# Patient Record
Sex: Female | Born: 1960 | Race: Black or African American | Hispanic: No | Smoking: Never smoker
Health system: Southern US, Community
[De-identification: ages and names within clinical notes are randomized; demographics above are authoritative.]

---

## 2003-12-04 ENCOUNTER — Emergency Department (HOSPITAL_COMMUNITY): Admission: EM | Admit: 2003-12-04 | Discharge: 2003-12-04 | Payer: Self-pay | Admitting: *Deleted

## 2004-04-25 ENCOUNTER — Encounter: Admission: RE | Admit: 2004-04-25 | Discharge: 2004-04-25 | Payer: Self-pay | Admitting: Obstetrics and Gynecology

## 2004-05-01 ENCOUNTER — Encounter: Admission: RE | Admit: 2004-05-01 | Discharge: 2004-05-01 | Payer: Self-pay | Admitting: Obstetrics and Gynecology

## 2004-08-26 ENCOUNTER — Encounter: Admission: RE | Admit: 2004-08-26 | Discharge: 2004-08-26 | Payer: Self-pay | Admitting: General Surgery

## 2005-01-15 ENCOUNTER — Emergency Department (HOSPITAL_COMMUNITY): Admission: EM | Admit: 2005-01-15 | Discharge: 2005-01-15 | Payer: Self-pay | Admitting: Emergency Medicine

## 2005-06-22 ENCOUNTER — Encounter: Admission: RE | Admit: 2005-06-22 | Discharge: 2005-06-22 | Payer: Self-pay | Admitting: Obstetrics and Gynecology

## 2005-06-30 ENCOUNTER — Encounter: Admission: RE | Admit: 2005-06-30 | Discharge: 2005-06-30 | Payer: Self-pay | Admitting: Obstetrics and Gynecology

## 2005-12-16 ENCOUNTER — Encounter: Admission: RE | Admit: 2005-12-16 | Discharge: 2005-12-16 | Payer: Self-pay | Admitting: *Deleted

## 2006-02-16 ENCOUNTER — Encounter (HOSPITAL_COMMUNITY): Admission: RE | Admit: 2006-02-16 | Discharge: 2006-05-17 | Payer: Self-pay | Admitting: Endocrinology

## 2006-02-22 ENCOUNTER — Encounter: Admission: RE | Admit: 2006-02-22 | Discharge: 2006-02-22 | Payer: Self-pay | Admitting: Endocrinology

## 2006-02-25 ENCOUNTER — Encounter: Admission: RE | Admit: 2006-02-25 | Discharge: 2006-02-25 | Payer: Self-pay | Admitting: Endocrinology

## 2006-05-07 ENCOUNTER — Encounter: Admission: RE | Admit: 2006-05-07 | Discharge: 2006-05-07 | Payer: Self-pay | Admitting: Obstetrics and Gynecology

## 2006-06-03 ENCOUNTER — Emergency Department (HOSPITAL_COMMUNITY): Admission: EM | Admit: 2006-06-03 | Discharge: 2006-06-03 | Payer: Self-pay | Admitting: Family Medicine

## 2006-11-13 ENCOUNTER — Emergency Department (HOSPITAL_COMMUNITY): Admission: EM | Admit: 2006-11-13 | Discharge: 2006-11-13 | Payer: Self-pay | Admitting: Emergency Medicine

## 2007-01-08 ENCOUNTER — Emergency Department (HOSPITAL_COMMUNITY): Admission: EM | Admit: 2007-01-08 | Discharge: 2007-01-08 | Payer: Self-pay | Admitting: Family Medicine

## 2007-05-09 ENCOUNTER — Encounter: Admission: RE | Admit: 2007-05-09 | Discharge: 2007-05-09 | Payer: Self-pay | Admitting: Obstetrics and Gynecology

## 2008-04-06 ENCOUNTER — Emergency Department (HOSPITAL_COMMUNITY): Admission: EM | Admit: 2008-04-06 | Discharge: 2008-04-06 | Payer: Self-pay | Admitting: Emergency Medicine

## 2008-06-21 ENCOUNTER — Encounter: Admission: RE | Admit: 2008-06-21 | Discharge: 2008-06-21 | Payer: Self-pay | Admitting: Obstetrics and Gynecology

## 2008-08-09 ENCOUNTER — Encounter: Admission: RE | Admit: 2008-08-09 | Discharge: 2008-08-09 | Payer: Self-pay | Admitting: Obstetrics and Gynecology

## 2009-04-15 ENCOUNTER — Encounter: Admission: RE | Admit: 2009-04-15 | Discharge: 2009-04-15 | Payer: Self-pay | Admitting: Obstetrics and Gynecology

## 2009-07-10 ENCOUNTER — Encounter: Admission: RE | Admit: 2009-07-10 | Discharge: 2009-07-10 | Payer: Self-pay | Admitting: Obstetrics and Gynecology

## 2009-12-13 ENCOUNTER — Emergency Department (HOSPITAL_COMMUNITY): Admission: EM | Admit: 2009-12-13 | Discharge: 2009-12-13 | Payer: Self-pay | Admitting: Emergency Medicine

## 2010-01-17 ENCOUNTER — Emergency Department (HOSPITAL_COMMUNITY): Admission: EM | Admit: 2010-01-17 | Discharge: 2010-01-17 | Payer: Self-pay | Admitting: Emergency Medicine

## 2010-07-11 ENCOUNTER — Encounter: Admission: RE | Admit: 2010-07-11 | Discharge: 2010-07-11 | Payer: Self-pay | Admitting: Obstetrics and Gynecology

## 2010-12-01 ENCOUNTER — Ambulatory Visit (HOSPITAL_COMMUNITY)
Admission: RE | Admit: 2010-12-01 | Discharge: 2010-12-01 | Payer: Self-pay | Source: Home / Self Care | Attending: Gastroenterology | Admitting: Gastroenterology

## 2011-01-16 ENCOUNTER — Other Ambulatory Visit (HOSPITAL_COMMUNITY): Payer: Self-pay | Admitting: Orthopedic Surgery

## 2011-01-16 DIAGNOSIS — T148XXA Other injury of unspecified body region, initial encounter: Secondary | ICD-10-CM

## 2011-01-16 DIAGNOSIS — M25561 Pain in right knee: Secondary | ICD-10-CM

## 2011-01-29 ENCOUNTER — Ambulatory Visit (HOSPITAL_COMMUNITY)
Admission: RE | Admit: 2011-01-29 | Discharge: 2011-01-29 | Disposition: A | Discharge status: Home / Self Care | Payer: 59 | Source: Ambulatory Visit | Attending: Orthopedic Surgery | Admitting: Orthopedic Surgery

## 2011-01-29 DIAGNOSIS — M25561 Pain in right knee: Secondary | ICD-10-CM

## 2011-01-29 DIAGNOSIS — M7120 Synovial cyst of popliteal space [Baker], unspecified knee: Secondary | ICD-10-CM | POA: Insufficient documentation

## 2011-01-29 DIAGNOSIS — T148XXA Other injury of unspecified body region, initial encounter: Secondary | ICD-10-CM

## 2011-01-29 DIAGNOSIS — M25469 Effusion, unspecified knee: Secondary | ICD-10-CM | POA: Insufficient documentation

## 2011-01-29 DIAGNOSIS — M25569 Pain in unspecified knee: Secondary | ICD-10-CM | POA: Insufficient documentation

## 2011-01-29 DIAGNOSIS — M2240 Chondromalacia patellae, unspecified knee: Secondary | ICD-10-CM | POA: Insufficient documentation

## 2011-02-09 LAB — POCT I-STAT, CHEM 8
BUN: 10 mg/dL (ref 6–23)
Calcium, Ion: 1.16 mmol/L (ref 1.12–1.32)
Chloride: 105 mEq/L (ref 96–112)
Creatinine, Ser: 1 mg/dL (ref 0.4–1.2)
Glucose, Bld: 87 mg/dL (ref 70–99)
HCT: 41 % (ref 36.0–46.0)
Hemoglobin: 13.9 g/dL (ref 12.0–15.0)
Potassium: 4 mEq/L (ref 3.5–5.1)
Sodium: 138 mEq/L (ref 135–145)
TCO2: 28 mmol/L (ref 0–100)

## 2011-05-04 ENCOUNTER — Ambulatory Visit (INDEPENDENT_AMBULATORY_CARE_PROVIDER_SITE_OTHER): Payer: Commercial Managed Care - PPO

## 2011-05-04 ENCOUNTER — Inpatient Hospital Stay (INDEPENDENT_AMBULATORY_CARE_PROVIDER_SITE_OTHER)
Admission: RE | Admit: 2011-05-04 | Discharge: 2011-05-04 | Disposition: A | Discharge status: Home / Self Care | Payer: Commercial Managed Care - PPO | Source: Ambulatory Visit | Attending: Emergency Medicine | Admitting: Emergency Medicine

## 2011-05-04 DIAGNOSIS — S92919A Unspecified fracture of unspecified toe(s), initial encounter for closed fracture: Secondary | ICD-10-CM

## 2011-07-23 ENCOUNTER — Other Ambulatory Visit (HOSPITAL_COMMUNITY)
Admission: RE | Admit: 2011-07-23 | Discharge: 2011-07-23 | Disposition: A | Discharge status: Home / Self Care | Payer: 59 | Source: Ambulatory Visit | Attending: Obstetrics and Gynecology | Admitting: Obstetrics and Gynecology

## 2011-07-23 ENCOUNTER — Other Ambulatory Visit: Payer: Self-pay | Admitting: Obstetrics and Gynecology

## 2011-07-23 DIAGNOSIS — Z01419 Encounter for gynecological examination (general) (routine) without abnormal findings: Secondary | ICD-10-CM | POA: Insufficient documentation

## 2011-07-23 DIAGNOSIS — Z1231 Encounter for screening mammogram for malignant neoplasm of breast: Secondary | ICD-10-CM

## 2011-07-23 DIAGNOSIS — Z1159 Encounter for screening for other viral diseases: Secondary | ICD-10-CM | POA: Insufficient documentation

## 2011-07-31 ENCOUNTER — Ambulatory Visit
Admission: RE | Admit: 2011-07-31 | Discharge: 2011-07-31 | Disposition: A | Discharge status: Home / Self Care | Payer: 59 | Source: Ambulatory Visit | Attending: Obstetrics and Gynecology | Admitting: Obstetrics and Gynecology

## 2011-07-31 DIAGNOSIS — Z1231 Encounter for screening mammogram for malignant neoplasm of breast: Secondary | ICD-10-CM

## 2011-09-23 ENCOUNTER — Inpatient Hospital Stay (INDEPENDENT_AMBULATORY_CARE_PROVIDER_SITE_OTHER)
Admission: RE | Admit: 2011-09-23 | Discharge: 2011-09-23 | Disposition: A | Discharge status: Home / Self Care | Payer: 59 | Source: Ambulatory Visit | Attending: Family Medicine | Admitting: Family Medicine

## 2011-09-23 DIAGNOSIS — J40 Bronchitis, not specified as acute or chronic: Secondary | ICD-10-CM

## 2011-09-23 DIAGNOSIS — J069 Acute upper respiratory infection, unspecified: Secondary | ICD-10-CM

## 2013-03-08 ENCOUNTER — Other Ambulatory Visit: Payer: Self-pay | Admitting: Obstetrics and Gynecology

## 2013-03-08 DIAGNOSIS — Z1231 Encounter for screening mammogram for malignant neoplasm of breast: Secondary | ICD-10-CM

## 2013-04-12 ENCOUNTER — Ambulatory Visit
Admission: RE | Admit: 2013-04-12 | Discharge: 2013-04-12 | Disposition: A | Discharge status: Home / Self Care | Payer: Private Health Insurance - Indemnity | Source: Ambulatory Visit | Attending: Obstetrics and Gynecology | Admitting: Obstetrics and Gynecology

## 2013-04-12 DIAGNOSIS — Z1231 Encounter for screening mammogram for malignant neoplasm of breast: Secondary | ICD-10-CM

## 2014-04-02 ENCOUNTER — Other Ambulatory Visit: Payer: Self-pay

## 2014-04-02 DIAGNOSIS — Z1231 Encounter for screening mammogram for malignant neoplasm of breast: Secondary | ICD-10-CM

## 2014-04-13 ENCOUNTER — Ambulatory Visit
Admission: RE | Admit: 2014-04-13 | Discharge: 2014-04-13 | Disposition: A | Discharge status: Home / Self Care | Payer: BC Managed Care – PPO | Source: Ambulatory Visit

## 2014-04-13 ENCOUNTER — Encounter (INDEPENDENT_AMBULATORY_CARE_PROVIDER_SITE_OTHER): Payer: Self-pay

## 2014-04-13 DIAGNOSIS — Z1231 Encounter for screening mammogram for malignant neoplasm of breast: Secondary | ICD-10-CM

## 2014-08-10 ENCOUNTER — Other Ambulatory Visit: Payer: Self-pay | Admitting: Nurse Practitioner

## 2014-08-10 DIAGNOSIS — R109 Unspecified abdominal pain: Secondary | ICD-10-CM

## 2014-08-13 ENCOUNTER — Ambulatory Visit
Admission: RE | Admit: 2014-08-13 | Discharge: 2014-08-13 | Disposition: A | Discharge status: Home / Self Care | Payer: BC Managed Care – PPO | Source: Ambulatory Visit | Attending: Nurse Practitioner | Admitting: Nurse Practitioner

## 2014-08-13 DIAGNOSIS — R109 Unspecified abdominal pain: Secondary | ICD-10-CM

## 2015-04-30 ENCOUNTER — Other Ambulatory Visit: Payer: Self-pay | Admitting: Obstetrics and Gynecology

## 2015-04-30 DIAGNOSIS — Z1231 Encounter for screening mammogram for malignant neoplasm of breast: Secondary | ICD-10-CM

## 2015-05-10 ENCOUNTER — Ambulatory Visit
Admission: RE | Admit: 2015-05-10 | Discharge: 2015-05-10 | Disposition: A | Discharge status: Home / Self Care | Payer: BC Managed Care – PPO | Source: Ambulatory Visit | Attending: Obstetrics and Gynecology | Admitting: Obstetrics and Gynecology

## 2015-05-10 DIAGNOSIS — Z1231 Encounter for screening mammogram for malignant neoplasm of breast: Secondary | ICD-10-CM

## 2016-05-25 ENCOUNTER — Other Ambulatory Visit: Payer: Self-pay | Admitting: Obstetrics and Gynecology

## 2016-05-25 DIAGNOSIS — Z1231 Encounter for screening mammogram for malignant neoplasm of breast: Secondary | ICD-10-CM

## 2016-06-08 ENCOUNTER — Inpatient Hospital Stay: Admission: RE | Admit: 2016-06-08 | Payer: BC Managed Care – PPO | Source: Ambulatory Visit

## 2016-06-29 ENCOUNTER — Ambulatory Visit: Payer: BC Managed Care – PPO

## 2016-06-30 ENCOUNTER — Ambulatory Visit
Admission: RE | Admit: 2016-06-30 | Discharge: 2016-06-30 | Disposition: A | Discharge status: Home / Self Care | Payer: BC Managed Care – PPO | Source: Ambulatory Visit | Attending: Obstetrics and Gynecology | Admitting: Obstetrics and Gynecology

## 2016-06-30 DIAGNOSIS — Z1231 Encounter for screening mammogram for malignant neoplasm of breast: Secondary | ICD-10-CM

## 2017-06-24 ENCOUNTER — Other Ambulatory Visit: Payer: Self-pay | Admitting: Obstetrics and Gynecology

## 2017-06-24 DIAGNOSIS — Z1231 Encounter for screening mammogram for malignant neoplasm of breast: Secondary | ICD-10-CM

## 2017-06-29 ENCOUNTER — Other Ambulatory Visit: Payer: Self-pay | Admitting: Endocrinology

## 2017-06-29 DIAGNOSIS — E2839 Other primary ovarian failure: Secondary | ICD-10-CM

## 2017-06-29 DIAGNOSIS — E049 Nontoxic goiter, unspecified: Secondary | ICD-10-CM

## 2017-07-02 ENCOUNTER — Ambulatory Visit
Admission: RE | Admit: 2017-07-02 | Discharge: 2017-07-02 | Disposition: A | Discharge status: Home / Self Care | Payer: BC Managed Care – PPO | Source: Ambulatory Visit | Attending: Endocrinology | Admitting: Endocrinology

## 2017-07-02 DIAGNOSIS — E049 Nontoxic goiter, unspecified: Secondary | ICD-10-CM

## 2017-07-05 ENCOUNTER — Ambulatory Visit: Payer: BC Managed Care – PPO

## 2017-07-19 ENCOUNTER — Ambulatory Visit
Admission: RE | Admit: 2017-07-19 | Discharge: 2017-07-19 | Disposition: A | Discharge status: Home / Self Care | Payer: BC Managed Care – PPO | Source: Ambulatory Visit | Attending: Endocrinology | Admitting: Endocrinology

## 2017-07-19 ENCOUNTER — Ambulatory Visit
Admission: RE | Admit: 2017-07-19 | Discharge: 2017-07-19 | Disposition: A | Discharge status: Home / Self Care | Payer: BC Managed Care – PPO | Source: Ambulatory Visit | Attending: Obstetrics and Gynecology | Admitting: Obstetrics and Gynecology

## 2017-07-19 DIAGNOSIS — Z1231 Encounter for screening mammogram for malignant neoplasm of breast: Secondary | ICD-10-CM

## 2017-07-19 DIAGNOSIS — E2839 Other primary ovarian failure: Secondary | ICD-10-CM

## 2017-07-20 ENCOUNTER — Other Ambulatory Visit: Payer: Self-pay | Admitting: Obstetrics and Gynecology

## 2017-07-20 DIAGNOSIS — R928 Other abnormal and inconclusive findings on diagnostic imaging of breast: Secondary | ICD-10-CM

## 2017-07-27 ENCOUNTER — Other Ambulatory Visit: Payer: BC Managed Care – PPO

## 2017-07-27 ENCOUNTER — Ambulatory Visit
Admission: RE | Admit: 2017-07-27 | Discharge: 2017-07-27 | Disposition: A | Discharge status: Home / Self Care | Payer: BC Managed Care – PPO | Source: Ambulatory Visit | Attending: Obstetrics and Gynecology | Admitting: Obstetrics and Gynecology

## 2017-07-27 DIAGNOSIS — R928 Other abnormal and inconclusive findings on diagnostic imaging of breast: Secondary | ICD-10-CM

## 2018-05-16 ENCOUNTER — Other Ambulatory Visit: Payer: Self-pay | Admitting: Nurse Practitioner

## 2018-05-16 DIAGNOSIS — R1013 Epigastric pain: Secondary | ICD-10-CM

## 2018-05-27 ENCOUNTER — Ambulatory Visit
Admission: RE | Admit: 2018-05-27 | Discharge: 2018-05-27 | Disposition: A | Discharge status: Home / Self Care | Payer: BC Managed Care – PPO | Source: Ambulatory Visit | Attending: Nurse Practitioner | Admitting: Nurse Practitioner

## 2018-05-27 DIAGNOSIS — R1013 Epigastric pain: Secondary | ICD-10-CM

## 2018-07-19 ENCOUNTER — Other Ambulatory Visit: Payer: Self-pay | Admitting: Endocrinology

## 2018-07-19 DIAGNOSIS — E049 Nontoxic goiter, unspecified: Secondary | ICD-10-CM

## 2018-07-26 ENCOUNTER — Ambulatory Visit
Admission: RE | Admit: 2018-07-26 | Discharge: 2018-07-26 | Disposition: A | Discharge status: Home / Self Care | Payer: BC Managed Care – PPO | Source: Ambulatory Visit | Attending: Endocrinology | Admitting: Endocrinology

## 2018-07-26 DIAGNOSIS — E049 Nontoxic goiter, unspecified: Secondary | ICD-10-CM

## 2018-08-16 ENCOUNTER — Other Ambulatory Visit: Payer: Self-pay | Admitting: Surgery

## 2018-08-17 ENCOUNTER — Other Ambulatory Visit: Payer: Self-pay | Admitting: Obstetrics and Gynecology

## 2018-08-17 DIAGNOSIS — Z1231 Encounter for screening mammogram for malignant neoplasm of breast: Secondary | ICD-10-CM

## 2018-09-20 ENCOUNTER — Ambulatory Visit: Payer: BC Managed Care – PPO

## 2018-10-03 ENCOUNTER — Ambulatory Visit
Admission: RE | Admit: 2018-10-03 | Discharge: 2018-10-03 | Disposition: A | Discharge status: Home / Self Care | Payer: BC Managed Care – PPO | Source: Ambulatory Visit | Attending: Obstetrics and Gynecology | Admitting: Obstetrics and Gynecology

## 2018-10-03 DIAGNOSIS — Z1231 Encounter for screening mammogram for malignant neoplasm of breast: Secondary | ICD-10-CM

## 2018-10-04 ENCOUNTER — Other Ambulatory Visit: Payer: Self-pay | Admitting: Obstetrics and Gynecology

## 2018-10-04 DIAGNOSIS — R928 Other abnormal and inconclusive findings on diagnostic imaging of breast: Secondary | ICD-10-CM

## 2018-10-10 ENCOUNTER — Other Ambulatory Visit: Payer: BC Managed Care – PPO

## 2018-10-10 ENCOUNTER — Ambulatory Visit
Admission: RE | Admit: 2018-10-10 | Discharge: 2018-10-10 | Disposition: A | Discharge status: Home / Self Care | Payer: BC Managed Care – PPO | Source: Ambulatory Visit | Attending: Obstetrics and Gynecology | Admitting: Obstetrics and Gynecology

## 2018-10-10 ENCOUNTER — Other Ambulatory Visit: Payer: Self-pay | Admitting: Obstetrics and Gynecology

## 2018-10-10 ENCOUNTER — Ambulatory Visit: Payer: BC Managed Care – PPO

## 2018-10-10 DIAGNOSIS — R928 Other abnormal and inconclusive findings on diagnostic imaging of breast: Secondary | ICD-10-CM

## 2019-06-06 ENCOUNTER — Other Ambulatory Visit: Payer: Self-pay | Admitting: Rheumatology

## 2019-06-06 DIAGNOSIS — M5416 Radiculopathy, lumbar region: Secondary | ICD-10-CM

## 2019-06-06 DIAGNOSIS — M25552 Pain in left hip: Secondary | ICD-10-CM

## 2019-06-26 ENCOUNTER — Ambulatory Visit
Admission: RE | Admit: 2019-06-26 | Discharge: 2019-06-26 | Disposition: A | Discharge status: Home / Self Care | Payer: BC Managed Care – PPO | Source: Ambulatory Visit | Attending: Rheumatology | Admitting: Rheumatology

## 2019-06-26 ENCOUNTER — Other Ambulatory Visit: Payer: Self-pay

## 2019-06-26 DIAGNOSIS — M5416 Radiculopathy, lumbar region: Secondary | ICD-10-CM

## 2019-06-26 DIAGNOSIS — M25552 Pain in left hip: Secondary | ICD-10-CM

## 2019-09-27 ENCOUNTER — Other Ambulatory Visit: Payer: Self-pay | Admitting: *Deleted

## 2019-09-27 ENCOUNTER — Other Ambulatory Visit: Payer: Self-pay | Admitting: Obstetrics and Gynecology

## 2019-09-27 DIAGNOSIS — Z1231 Encounter for screening mammogram for malignant neoplasm of breast: Secondary | ICD-10-CM

## 2019-10-04 ENCOUNTER — Other Ambulatory Visit: Payer: Self-pay | Admitting: Rehabilitation

## 2019-10-04 DIAGNOSIS — M1612 Unilateral primary osteoarthritis, left hip: Secondary | ICD-10-CM

## 2019-10-27 ENCOUNTER — Ambulatory Visit
Admission: RE | Admit: 2019-10-27 | Discharge: 2019-10-27 | Disposition: A | Discharge status: Home / Self Care | Payer: BC Managed Care – PPO | Source: Ambulatory Visit | Attending: Rehabilitation | Admitting: Rehabilitation

## 2019-10-27 ENCOUNTER — Other Ambulatory Visit: Payer: Self-pay

## 2019-10-27 DIAGNOSIS — M1612 Unilateral primary osteoarthritis, left hip: Secondary | ICD-10-CM

## 2019-11-21 ENCOUNTER — Ambulatory Visit
Admission: RE | Admit: 2019-11-21 | Discharge: 2019-11-21 | Disposition: A | Discharge status: Home / Self Care | Payer: BC Managed Care – PPO | Source: Ambulatory Visit | Attending: Obstetrics and Gynecology | Admitting: Obstetrics and Gynecology

## 2019-11-21 ENCOUNTER — Other Ambulatory Visit: Payer: Self-pay

## 2019-11-21 DIAGNOSIS — Z1231 Encounter for screening mammogram for malignant neoplasm of breast: Secondary | ICD-10-CM

## 2020-01-28 ENCOUNTER — Ambulatory Visit: Payer: BC Managed Care – PPO | Attending: Internal Medicine

## 2020-01-28 DIAGNOSIS — Z23 Encounter for immunization: Secondary | ICD-10-CM

## 2020-01-28 NOTE — Progress Notes (Signed)
   Covid-19 Vaccination Clinic  Name:  ALELI NAVEDO    MRN: [id number] DOB: October 28, 1961  01/28/2020  Ms. Rettke was observed post Covid-19 immunization for 15 minutes without incident. She was provided with Vaccine Information Sheet and instruction to access the V-Safe system.   Ms. Corum was instructed to call 911 with any severe reactions post vaccine: Marland Kitchen Difficulty breathing  . Swelling of face and throat  . A fast heartbeat  . A bad rash all over body  . Dizziness and weakness   Immunizations Administered    Name Date Dose VIS Date Route   Pfizer COVID-19 Vaccine 01/28/2020 10:11 AM 0.3 mL 11/03/2019 Intramuscular   Manufacturer: [employer], [employer]   Lot: [id number]   NDC: [address]-1

## 2020-02-19 ENCOUNTER — Ambulatory Visit: Payer: BC Managed Care – PPO

## 2020-02-21 ENCOUNTER — Ambulatory Visit: Payer: BC Managed Care – PPO | Attending: Internal Medicine

## 2020-02-21 DIAGNOSIS — Z23 Encounter for immunization: Secondary | ICD-10-CM

## 2020-02-21 NOTE — Progress Notes (Signed)
   Covid-19 Vaccination Clinic  Name:  LARIN DEPAOLI    MRN: [id number] DOB: 02-19-1961  02/21/2020  Ms. Bouillon was observed post Covid-19 immunization for 15 minutes without incident. She was provided with Vaccine Information Sheet and instruction to access the V-Safe system.   Ms. Schmelzle was instructed to call 911 with any severe reactions post vaccine: Marland Kitchen Difficulty breathing  . Swelling of face and throat  . A fast heartbeat  . A bad rash all over body  . Dizziness and weakness   Immunizations Administered    Name Date Dose VIS Date Route   Pfizer COVID-19 Vaccine 02/21/2020  9:56 AM 0.3 mL 11/03/2019 Intramuscular   Manufacturer: [employer], [employer]   Lot: [id number]   NDC: [address]-5

## 2020-04-15 ENCOUNTER — Other Ambulatory Visit: Payer: Self-pay | Admitting: Endocrinology

## 2020-04-15 DIAGNOSIS — E049 Nontoxic goiter, unspecified: Secondary | ICD-10-CM

## 2020-04-23 ENCOUNTER — Other Ambulatory Visit: Payer: Self-pay | Admitting: Rheumatology

## 2020-04-23 ENCOUNTER — Other Ambulatory Visit (HOSPITAL_COMMUNITY): Payer: Self-pay | Admitting: Rheumatology

## 2020-04-23 DIAGNOSIS — E0590 Thyrotoxicosis, unspecified without thyrotoxic crisis or storm: Secondary | ICD-10-CM

## 2020-04-25 ENCOUNTER — Ambulatory Visit
Admission: RE | Admit: 2020-04-25 | Discharge: 2020-04-25 | Disposition: A | Discharge status: Home / Self Care | Payer: BC Managed Care – PPO | Source: Ambulatory Visit | Attending: Endocrinology | Admitting: Endocrinology

## 2020-04-25 DIAGNOSIS — E049 Nontoxic goiter, unspecified: Secondary | ICD-10-CM

## 2020-04-29 ENCOUNTER — Other Ambulatory Visit: Payer: Self-pay

## 2020-04-29 ENCOUNTER — Encounter (HOSPITAL_COMMUNITY)
Admission: RE | Admit: 2020-04-29 | Discharge: 2020-04-29 | Disposition: A | Discharge status: Home / Self Care | Payer: BC Managed Care – PPO | Source: Ambulatory Visit | Attending: Rheumatology | Admitting: Rheumatology

## 2020-04-29 DIAGNOSIS — E0590 Thyrotoxicosis, unspecified without thyrotoxic crisis or storm: Secondary | ICD-10-CM | POA: Insufficient documentation

## 2020-04-30 ENCOUNTER — Encounter (HOSPITAL_COMMUNITY)
Admission: RE | Admit: 2020-04-30 | Discharge: 2020-04-30 | Disposition: A | Discharge status: Home / Self Care | Payer: BC Managed Care – PPO | Source: Ambulatory Visit | Attending: Rheumatology | Admitting: Rheumatology

## 2020-04-30 MED ORDER — SODIUM IODIDE I-123 7.4 MBQ CAPS
457.0000 | ORAL_CAPSULE | Freq: Once | ORAL | Status: AC
  Administered 2020-04-29: 457 via ORAL

## 2020-05-07 ENCOUNTER — Other Ambulatory Visit: Payer: Self-pay | Admitting: Endocrinology

## 2020-05-07 DIAGNOSIS — E049 Nontoxic goiter, unspecified: Secondary | ICD-10-CM

## 2020-05-29 ENCOUNTER — Ambulatory Visit
Admission: RE | Admit: 2020-05-29 | Discharge: 2020-05-29 | Disposition: A | Discharge status: Home / Self Care | Payer: BC Managed Care – PPO | Source: Ambulatory Visit | Attending: Endocrinology | Admitting: Endocrinology

## 2020-05-29 ENCOUNTER — Other Ambulatory Visit (HOSPITAL_COMMUNITY)
Admission: RE | Admit: 2020-05-29 | Discharge: 2020-05-29 | Disposition: A | Discharge status: Home / Self Care | Payer: BC Managed Care – PPO | Source: Ambulatory Visit | Attending: Endocrinology | Admitting: Endocrinology

## 2020-05-29 DIAGNOSIS — E041 Nontoxic single thyroid nodule: Secondary | ICD-10-CM | POA: Diagnosis not present

## 2020-05-29 DIAGNOSIS — E049 Nontoxic goiter, unspecified: Secondary | ICD-10-CM

## 2020-05-29 NOTE — Procedures (Signed)
PROCEDURE SUMMARY:  Using direct ultrasound guidance, 5 passes were made using 25 g needles into the nodule within the right lobe of the thyroid.   Ultrasound was used to confirm needle placements on all occasions.   EBL = trace  Specimens were sent to Pathology for analysis.  See procedure note under Imaging tab in Epic for full procedure details.  Chezney Huether S Tyjae Issa PA-C 05/29/2020 2:49 PM

## 2020-05-31 LAB — CYTOLOGY - NON PAP

## 2020-06-18 ENCOUNTER — Encounter (HOSPITAL_COMMUNITY): Payer: Self-pay

## 2020-10-14 ENCOUNTER — Other Ambulatory Visit: Payer: Self-pay | Admitting: Obstetrics and Gynecology

## 2020-10-14 DIAGNOSIS — Z0000 Encounter for general adult medical examination without abnormal findings: Secondary | ICD-10-CM

## 2020-12-02 ENCOUNTER — Ambulatory Visit: Payer: BC Managed Care – PPO

## 2020-12-25 ENCOUNTER — Other Ambulatory Visit: Payer: Self-pay

## 2020-12-25 ENCOUNTER — Ambulatory Visit
Admission: RE | Admit: 2020-12-25 | Discharge: 2020-12-25 | Disposition: A | Discharge status: Home / Self Care | Payer: BC Managed Care – PPO | Source: Ambulatory Visit | Attending: Obstetrics and Gynecology | Admitting: Obstetrics and Gynecology

## 2020-12-25 DIAGNOSIS — Z0000 Encounter for general adult medical examination without abnormal findings: Secondary | ICD-10-CM

## 2022-01-27 ENCOUNTER — Other Ambulatory Visit: Payer: Self-pay | Admitting: Obstetrics and Gynecology

## 2022-01-27 DIAGNOSIS — Z1231 Encounter for screening mammogram for malignant neoplasm of breast: Secondary | ICD-10-CM

## 2022-02-06 ENCOUNTER — Ambulatory Visit
Admission: RE | Admit: 2022-02-06 | Discharge: 2022-02-06 | Disposition: A | Discharge status: Home / Self Care | Payer: BC Managed Care – PPO | Source: Ambulatory Visit | Attending: Obstetrics and Gynecology | Admitting: Obstetrics and Gynecology

## 2022-02-06 ENCOUNTER — Other Ambulatory Visit: Payer: Self-pay

## 2022-02-06 DIAGNOSIS — Z1231 Encounter for screening mammogram for malignant neoplasm of breast: Secondary | ICD-10-CM

## 2022-07-31 ENCOUNTER — Ambulatory Visit
Admission: EM | Admit: 2022-07-31 | Discharge: 2022-07-31 | Disposition: A | Discharge status: Home / Self Care | Payer: BC Managed Care – PPO | Attending: Physician Assistant | Admitting: Physician Assistant

## 2022-07-31 DIAGNOSIS — R1030 Lower abdominal pain, unspecified: Secondary | ICD-10-CM | POA: Diagnosis present

## 2022-07-31 LAB — POCT URINALYSIS DIP (MANUAL ENTRY)
Bilirubin, UA: NEGATIVE
Glucose, UA: NEGATIVE mg/dL
Ketones, POC UA: NEGATIVE mg/dL
Leukocytes, UA: NEGATIVE
Nitrite, UA: NEGATIVE
Protein Ur, POC: NEGATIVE mg/dL
Spec Grav, UA: 1.02 (ref 1.010–1.025)
Urobilinogen, UA: 0.2 E.U./dL
pH, UA: 5.5 (ref 5.0–8.0)

## 2022-07-31 MED ORDER — NITROFURANTOIN MONOHYD MACRO 100 MG PO CAPS: 100.0000 mg | ORAL_CAPSULE | Freq: Two times a day (BID) | ORAL | 0 refills | Status: AC

## 2022-07-31 NOTE — ED Triage Notes (Signed)
Pt presents to [facility] with co of fevers, lower abd pain and urinary retention. Pt reports she did take tylenol at home for symptoms.

## 2022-07-31 NOTE — ED Provider Notes (Signed)
[facility]    CSN: [id number] Arrival date & time: 07/31/22  1828      History   Chief Complaint Chief Complaint  Patient presents with   Cystitis    HPI Rachel Estrada is a 61 y.o. female.   Patient here today for evaluation of fever, lower abdominal pain, and urinary retention. She has been taking tylenol with mild improvement. She has not had any nausea or vomiting. She denies diarrhea. She has not had dysuria. It is difficult to determine urinary frequency as patient reports she drinks a lot of water at baseline.   The history is provided by the patient.    History reviewed. No pertinent past medical history.  There are no problems to display for this patient.   History reviewed. No pertinent surgical history.  OB History   No obstetric history on file.      Home Medications    Prior to Admission medications   Medication Sig Start Date End Date Taking? Authorizing Provider  nitrofurantoin, macrocrystal-monohydrate, (MACROBID) 100 MG capsule Take 1 capsule (100 mg total) by mouth 2 (two) times daily. 07/31/22  Yes Tomi Bamberger, PA-C    Family History Family History  Family history unknown: Yes    Social History Social History   Tobacco Use   Smoking status: Never   Smokeless tobacco: Never  Substance Use Topics   Alcohol use: Not Currently   Drug use: Not Currently     Allergies   Patient has no known allergies.   Review of Systems Review of Systems  Constitutional:  Positive for fever. Negative for chills.  Respiratory:  Negative for shortness of breath.   Cardiovascular:  Negative for chest pain.  Gastrointestinal:  Positive for abdominal pain. Negative for nausea and vomiting.  Genitourinary:  Negative for dysuria and frequency.  Musculoskeletal:  Negative for back pain.     Physical Exam Triage Vital Signs ED Triage Vitals  Enc Vitals Group     BP      Pulse      Resp      Temp      Temp src      SpO2       Weight      Height      Head Circumference      Peak Flow      Pain Score      Pain Loc      Pain Edu?      Excl. in GC?    No data found.  Updated Vital Signs BP 129/81   Pulse 70   Temp 98.2 F (36.8 C) (Oral)   Resp 18   SpO2 98%   Physical Exam Vitals and nursing note reviewed.  Constitutional:      General: She is not in acute distress.    Appearance: Normal appearance. She is not ill-appearing.  HENT:     Head: Normocephalic and atraumatic.     Nose: Nose normal.  Cardiovascular:     Rate and Rhythm: Normal rate and regular rhythm.     Heart sounds: Normal heart sounds. No murmur heard. Pulmonary:     Effort: Pulmonary effort is normal. No respiratory distress.     Breath sounds: Normal breath sounds. No wheezing, rhonchi or rales.  Abdominal:     General: Abdomen is flat. Bowel sounds are normal. There is no distension.     Palpations: Abdomen is soft.     Tenderness: There is abdominal tenderness (  suprapubic TTP). There is no guarding.  Skin:    General: Skin is warm and dry.  Neurological:     Mental Status: She is alert.  Psychiatric:        Mood and Affect: Mood normal.        Thought Content: Thought content normal.      UC Treatments / Results  Labs (all labs ordered are listed, but only abnormal results are displayed) Labs Reviewed  POCT URINALYSIS DIP (MANUAL ENTRY) - Abnormal; Notable for the following components:      Result Value   Blood, UA trace-intact (*)    All other components within normal limits  URINE CULTURE    EKG   Radiology No results found.  Procedures Procedures (including critical care time)  Medications Ordered in UC Medications - No data to display  Initial Impression / Assessment and Plan / UC Course  I have reviewed the triage vital signs and the nursing notes.  Pertinent labs & imaging results that were available during my care of the patient were reviewed by me and considered in my medical decision making  (see chart for details).    UA clear- given symptoms will treat to cover possible UTI and urine culture ordered. Recommended evaluation in the ED with any worsening abdominal pain as she may need imaging with worsening. Patient expresses understanding.   Final Clinical Impressions(s) / UC Diagnoses   Final diagnoses:  Lower abdominal pain   Discharge Instructions   None    ED Prescriptions     Medication Sig Dispense Auth. Provider   nitrofurantoin, macrocrystal-monohydrate, (MACROBID) 100 MG capsule Take 1 capsule (100 mg total) by mouth 2 (two) times daily. 10 capsule Tomi Bamberger, PA-C      PDMP not reviewed this encounter.   Tomi Bamberger, PA-C 07/31/22 1925

## 2022-08-02 LAB — URINE CULTURE: Culture: NO GROWTH

## 2022-10-14 ENCOUNTER — Other Ambulatory Visit: Payer: Self-pay | Admitting: Endocrinology

## 2022-10-14 DIAGNOSIS — E049 Nontoxic goiter, unspecified: Secondary | ICD-10-CM

## 2022-10-30 ENCOUNTER — Ambulatory Visit
Admission: RE | Admit: 2022-10-30 | Discharge: 2022-10-30 | Disposition: A | Discharge status: Home / Self Care | Payer: BC Managed Care – PPO | Source: Ambulatory Visit | Attending: Endocrinology | Admitting: Endocrinology

## 2022-10-30 DIAGNOSIS — E049 Nontoxic goiter, unspecified: Secondary | ICD-10-CM

## 2023-02-15 ENCOUNTER — Other Ambulatory Visit: Payer: Self-pay | Admitting: Obstetrics and Gynecology

## 2023-02-15 ENCOUNTER — Other Ambulatory Visit: Payer: Self-pay | Admitting: Internal Medicine

## 2023-02-15 DIAGNOSIS — Z1231 Encounter for screening mammogram for malignant neoplasm of breast: Secondary | ICD-10-CM

## 2023-04-02 ENCOUNTER — Ambulatory Visit
Admission: RE | Admit: 2023-04-02 | Discharge: 2023-04-02 | Disposition: A | Discharge status: Home / Self Care | Payer: BC Managed Care – PPO | Source: Ambulatory Visit | Attending: Obstetrics and Gynecology | Admitting: Obstetrics and Gynecology

## 2023-04-02 DIAGNOSIS — Z1231 Encounter for screening mammogram for malignant neoplasm of breast: Secondary | ICD-10-CM

## 2023-10-01 ENCOUNTER — Other Ambulatory Visit: Payer: Self-pay | Admitting: Internal Medicine

## 2023-10-01 ENCOUNTER — Ambulatory Visit
Admission: RE | Admit: 2023-10-01 | Discharge: 2023-10-01 | Disposition: A | Discharge status: Home / Self Care | Payer: BC Managed Care – PPO | Source: Ambulatory Visit | Attending: Internal Medicine | Admitting: Internal Medicine

## 2023-10-01 DIAGNOSIS — R059 Cough, unspecified: Secondary | ICD-10-CM

## 2023-10-11 IMAGING — MG MM DIGITAL SCREENING BILAT W/ TOMO AND CAD
8 series · 8 of 24 positions shown · non-contrast
Comparison: Previous exam(s).

CLINICAL DATA: Screening.

EXAM:
DIGITAL SCREENING BILATERAL MAMMOGRAM WITH TOMOSYNTHESIS AND CAD
TECHNIQUE: Bilateral screening digital craniocaudal and mediolateral oblique
mammograms were obtained. Bilateral screening digital breast
tomosynthesis was performed. The images were evaluated with
computer-aided detection.

[R CC synth-2D]
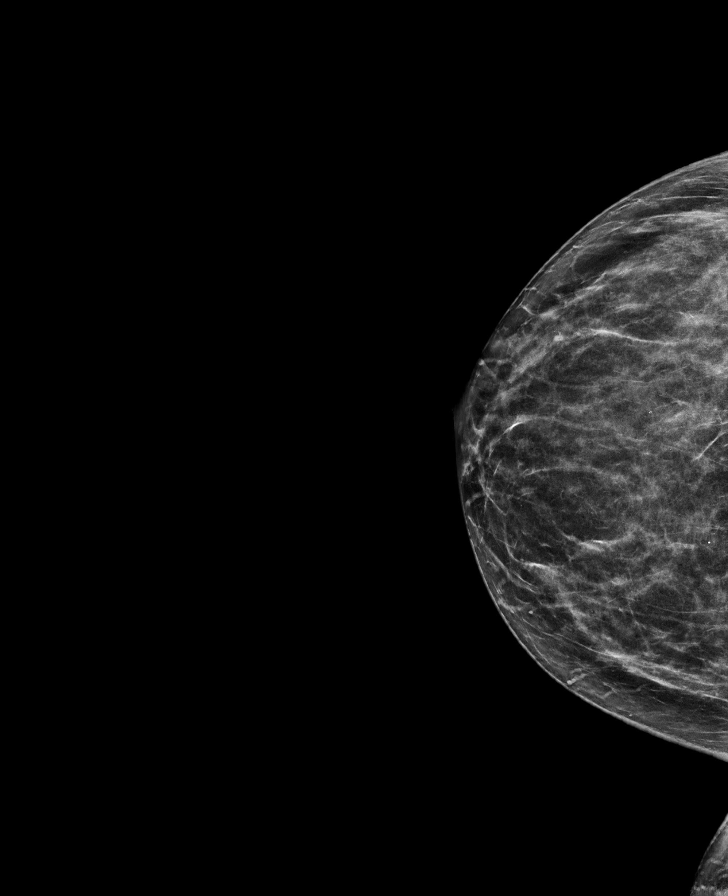

[L MLO synth-2D]
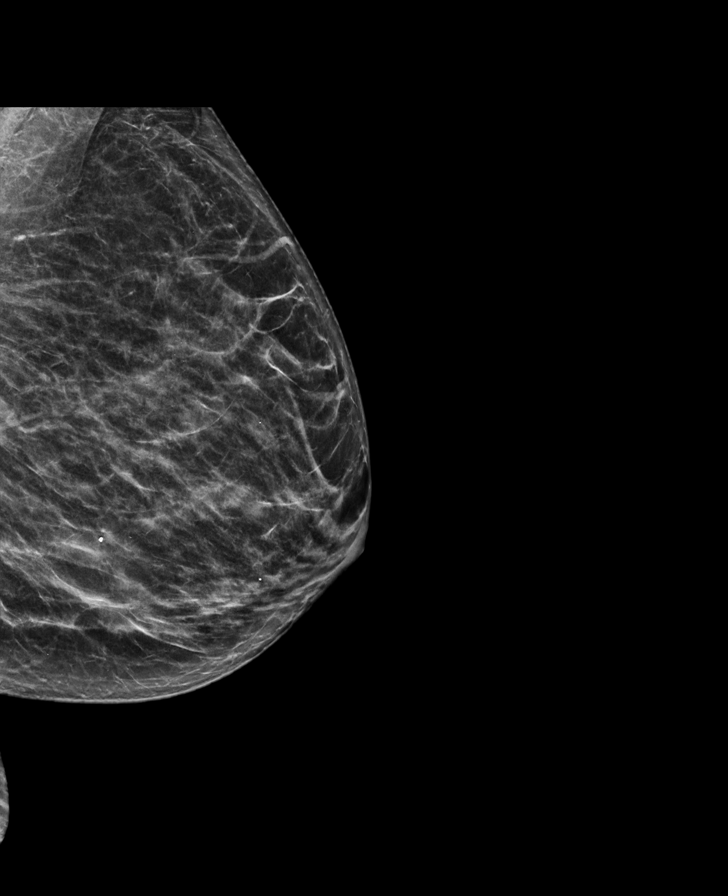

[R MLO synth-2D]
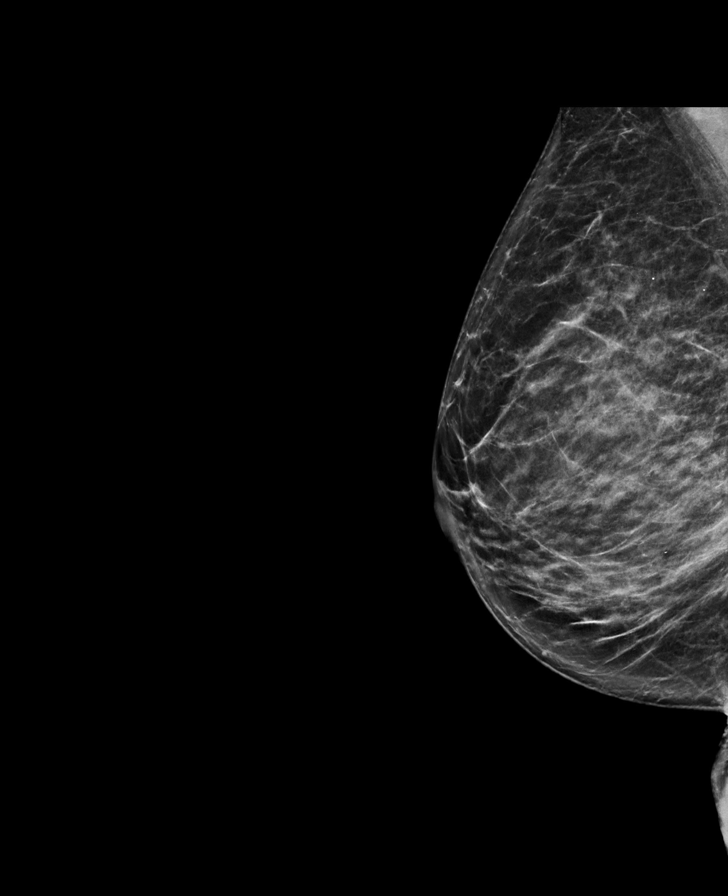

[L CC synth-2D]
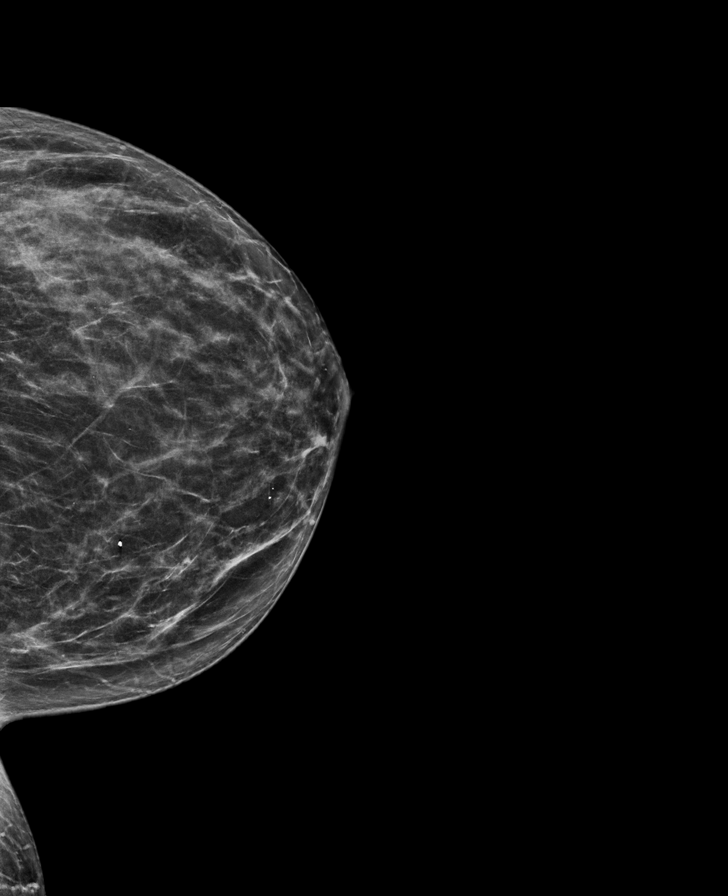

[L CC tomo · tomo slice 34/67.0]
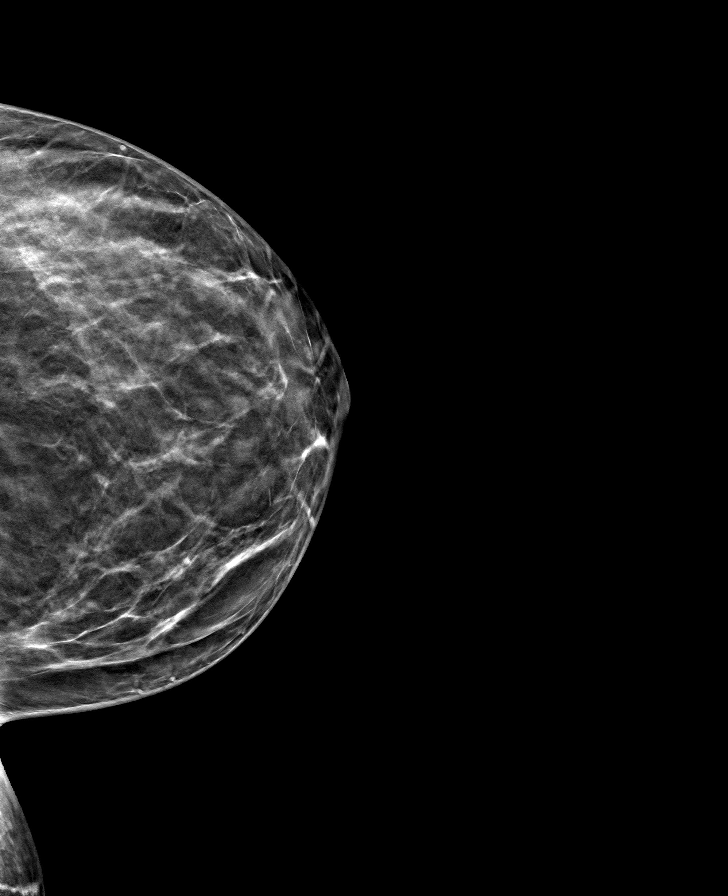

[L MLO tomo · tomo slice 33/66.0]
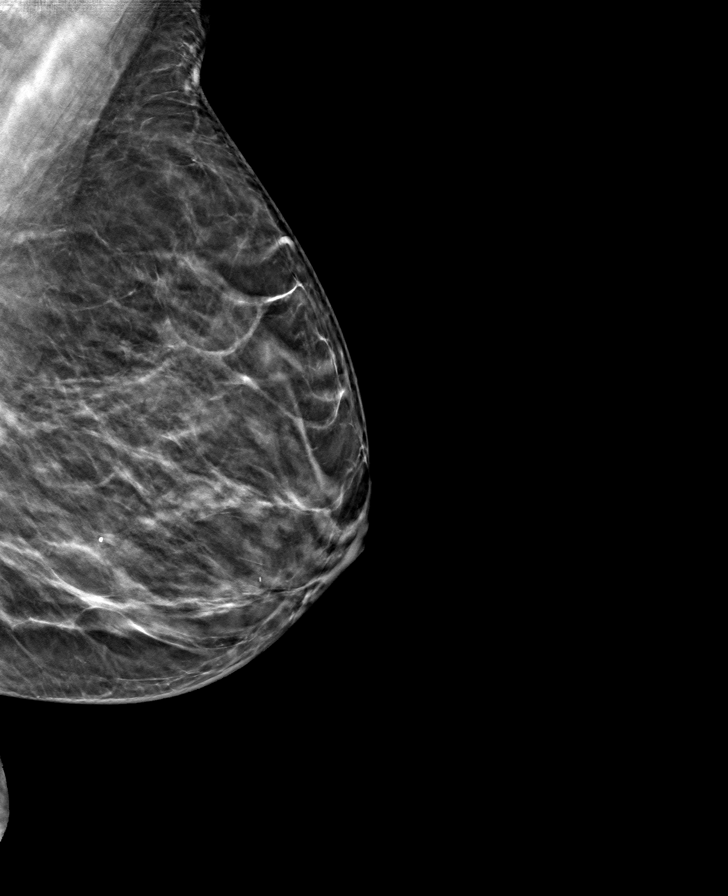

[R MLO tomo · tomo slice 36/71.0]
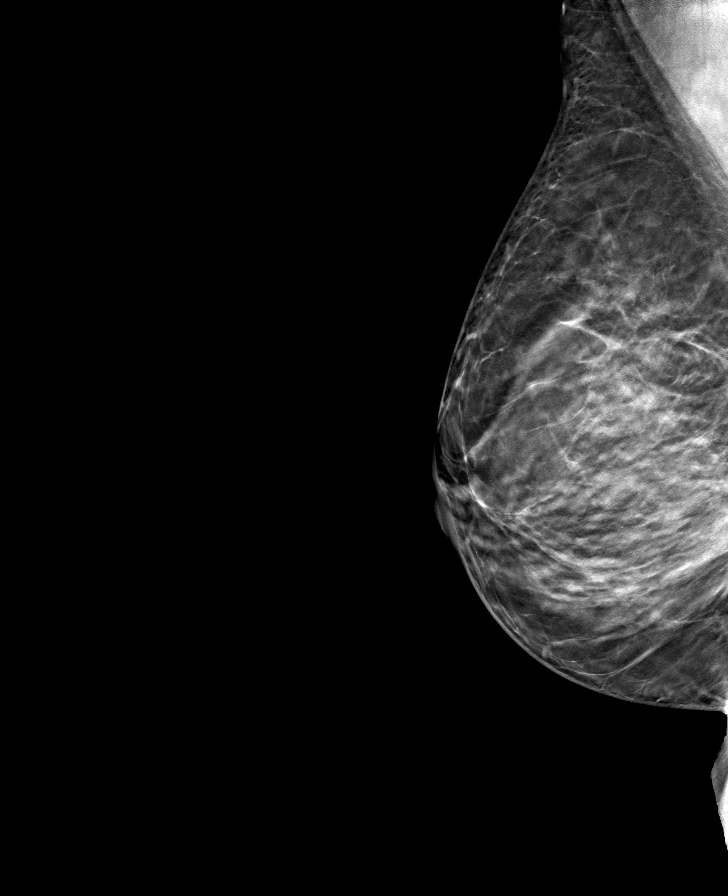

[R CC tomo · tomo slice 33/65.0]
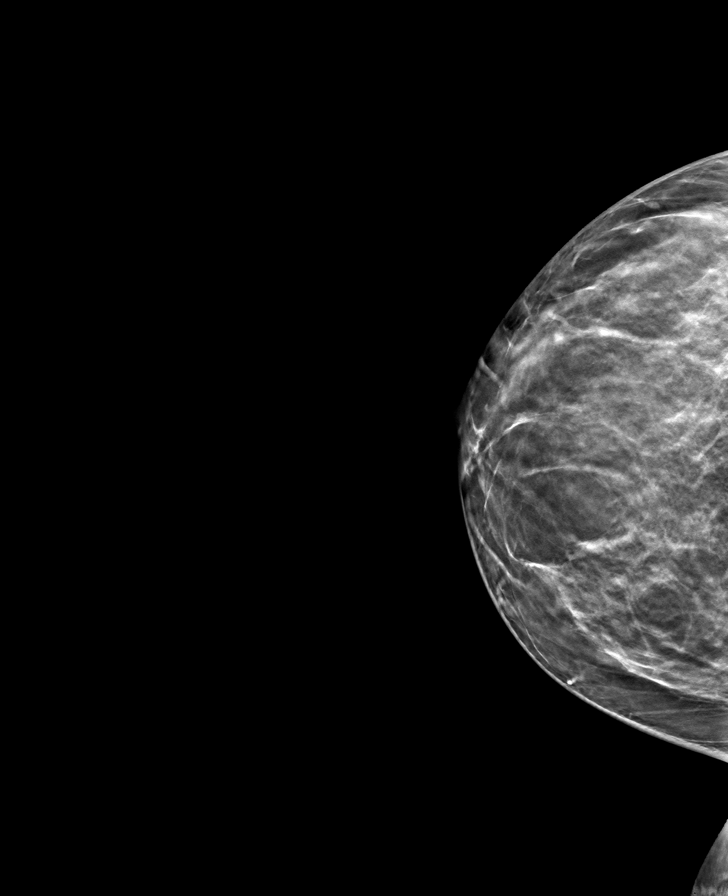

[8 of 24 positions shown; findings below may reference images not displayed]

ACR Breast Density Category c: The breast tissue is heterogeneously
dense, which may obscure small masses.
FINDINGS: There are no findings suspicious for malignancy.
IMPRESSION: No mammographic evidence of malignancy. A result letter of this
screening mammogram will be mailed directly to the patient.

RECOMMENDATION:
Screening mammogram in one year. (Code:Q3-W-BC3)

BI-RADS CATEGORY  1: Negative.

## 2023-10-15 ENCOUNTER — Other Ambulatory Visit (HOSPITAL_COMMUNITY): Payer: Self-pay | Admitting: Endocrinology

## 2023-10-15 DIAGNOSIS — E0590 Thyrotoxicosis, unspecified without thyrotoxic crisis or storm: Secondary | ICD-10-CM

## 2023-10-28 ENCOUNTER — Encounter (HOSPITAL_COMMUNITY)
Admission: RE | Admit: 2023-10-28 | Discharge: 2023-10-28 | Disposition: A | Discharge status: Home / Self Care | Payer: BC Managed Care – PPO | Source: Ambulatory Visit | Attending: Endocrinology | Admitting: Endocrinology

## 2023-10-28 DIAGNOSIS — E0590 Thyrotoxicosis, unspecified without thyrotoxic crisis or storm: Secondary | ICD-10-CM | POA: Diagnosis present

## 2023-10-28 MED ORDER — SODIUM IODIDE I-123 7.4 MBQ CAPS
398.0000 | ORAL_CAPSULE | Freq: Once | ORAL | Status: AC
  Administered 2023-10-28: 398 via ORAL

## 2023-10-29 ENCOUNTER — Encounter (HOSPITAL_COMMUNITY)
Admission: RE | Admit: 2023-10-29 | Discharge: 2023-10-29 | Disposition: A | Discharge status: Home / Self Care | Payer: BC Managed Care – PPO | Source: Ambulatory Visit | Attending: Endocrinology | Admitting: Endocrinology

## 2024-03-27 ENCOUNTER — Other Ambulatory Visit: Payer: Self-pay | Admitting: Obstetrics and Gynecology

## 2024-03-27 DIAGNOSIS — Z1231 Encounter for screening mammogram for malignant neoplasm of breast: Secondary | ICD-10-CM

## 2024-04-04 ENCOUNTER — Ambulatory Visit
Admission: RE | Admit: 2024-04-04 | Discharge: 2024-04-04 | Discharge status: Home / Self Care | Payer: Self-pay | Source: Ambulatory Visit | Attending: Obstetrics and Gynecology | Admitting: Obstetrics and Gynecology

## 2024-04-04 DIAGNOSIS — Z1231 Encounter for screening mammogram for malignant neoplasm of breast: Secondary | ICD-10-CM

## 2024-04-07 ENCOUNTER — Other Ambulatory Visit: Payer: Self-pay | Admitting: Obstetrics and Gynecology

## 2024-04-07 DIAGNOSIS — R928 Other abnormal and inconclusive findings on diagnostic imaging of breast: Secondary | ICD-10-CM

## 2024-04-13 ENCOUNTER — Ambulatory Visit

## 2024-04-13 ENCOUNTER — Ambulatory Visit
Admission: RE | Admit: 2024-04-13 | Discharge: 2024-04-13 | Disposition: A | Discharge status: Home / Self Care | Source: Ambulatory Visit | Attending: Obstetrics and Gynecology | Admitting: Obstetrics and Gynecology

## 2024-04-13 DIAGNOSIS — R928 Other abnormal and inconclusive findings on diagnostic imaging of breast: Secondary | ICD-10-CM
